# Patient Record
Sex: Female | Born: 1995 | Hispanic: No | Marital: Married | Smoking: Former smoker
Health system: Southern US, Community
[De-identification: ages and names within clinical notes are randomized; demographics above are authoritative.]

## PROBLEM LIST (undated history)

## (undated) ENCOUNTER — Inpatient Hospital Stay (HOSPITAL_COMMUNITY): Payer: Self-pay

## (undated) DIAGNOSIS — N189 Chronic kidney disease, unspecified: Secondary | ICD-10-CM

## (undated) HISTORY — PX: NO PAST SURGERIES: SHX2092

---

## 2016-12-27 ENCOUNTER — Encounter (HOSPITAL_COMMUNITY): Payer: Self-pay | Admitting: *Deleted

## 2016-12-27 ENCOUNTER — Inpatient Hospital Stay (HOSPITAL_COMMUNITY)
Admission: AD | Admit: 2016-12-27 | Discharge: 2016-12-27 | Disposition: A | Discharge status: Home / Self Care | Payer: Medicaid Other | Source: Ambulatory Visit | Attending: Obstetrics and Gynecology | Admitting: Obstetrics and Gynecology

## 2016-12-27 DIAGNOSIS — O218 Other vomiting complicating pregnancy: Secondary | ICD-10-CM | POA: Insufficient documentation

## 2016-12-27 DIAGNOSIS — O219 Vomiting of pregnancy, unspecified: Secondary | ICD-10-CM | POA: Diagnosis not present

## 2016-12-27 DIAGNOSIS — Z3A15 15 weeks gestation of pregnancy: Secondary | ICD-10-CM | POA: Insufficient documentation

## 2016-12-27 LAB — URINALYSIS, ROUTINE W REFLEX MICROSCOPIC
Bilirubin Urine: NEGATIVE
Glucose, UA: NEGATIVE mg/dL
Hgb urine dipstick: NEGATIVE
KETONES UR: NEGATIVE mg/dL
Nitrite: NEGATIVE
PH: 5 (ref 5.0–8.0)
Protein, ur: NEGATIVE mg/dL
SPECIFIC GRAVITY, URINE: 1.015 (ref 1.005–1.030)

## 2016-12-27 LAB — CBC
HCT: 32.1 % — ABNORMAL LOW (ref 36.0–46.0)
HEMOGLOBIN: 11.1 g/dL — AB (ref 12.0–15.0)
MCH: 27.1 pg (ref 26.0–34.0)
MCHC: 34.6 g/dL (ref 30.0–36.0)
MCV: 78.5 fL (ref 78.0–100.0)
PLATELETS: 148 10*3/uL — AB (ref 150–400)
RBC: 4.09 MIL/uL (ref 3.87–5.11)
RDW: 14.3 % (ref 11.5–15.5)
WBC: 9.8 10*3/uL (ref 4.0–10.5)

## 2016-12-27 MED ORDER — DOXYLAMINE-PYRIDOXINE 10-10 MG PO TBEC: DELAYED_RELEASE_TABLET | ORAL | 6 refills | Status: DC

## 2016-12-27 MED ORDER — SODIUM CHLORIDE 0.9 % IV SOLN
25.0000 mg | Freq: Once | INTRAVENOUS | Status: AC
  Administered 2016-12-27: 25 mg via INTRAVENOUS
  Filled 2016-12-27: qty 1

## 2016-12-27 NOTE — MAU Note (Signed)
Pt has been vomiting for several  1 week.  Feels like she might be constipated as well. C/o headache and "eyes" burning.

## 2016-12-27 NOTE — MAU Provider Note (Signed)
  History     CSN: [id number]  Arrival date and time: 12/27/16 1305   First Provider Initiated Contact with Patient 12/27/16 1352      Chief Complaint  Patient presents with  . Emesis   HPI 21 yo G1P0 at 2224w1d by LMP presenting today for the evaluation of nausea/emesis in pregnancy. Patient reports onset of emesis 2-3 weeks ago. She reports frequent snacks and being able to tolerate some liquids. However, she is not consuming large meals. She reports onset of headache this morning. She denies any sick contact. She reports subjective fevers. She denies dysuria. She denies vaginal bleeding or cramping pain. She has not started to feel fetal movement yet. She has started prenatal care but desires to transfer care in [place]   History reviewed. No pertinent past medical history.  No past surgical history on file.  No family history on file.  Social History  Substance Use Topics  . Smoking status: Not on file  . Smokeless tobacco: Not on file  . Alcohol use Not on file    Allergies: No Known Allergies  No prescriptions prior to admission.    Review of Systems  See pertinent in HPI Physical Exam   Blood pressure 135/73, pulse 116, temperature 98.8 F (37.1 C), temperature source Oral, resp. rate 18, height 4\' 11"  (1.499 m), weight 191 lb (86.6 kg), last menstrual period 09/12/2016.  Physical Exam GENERAL: Well-developed, well-nourished female in no acute distress.  ABDOMEN: Soft, nontender, nondistended.  PELVIC: Not indicated Bedside sono: +FH EXTREMITIES: No cyanosis, clubbing, or edema, 2+ distal pulses.  MAU Course  Procedures  MDM IV phenergan- Patient reports improvement following IVF and phenergan  CBC    Component Value Date/Time   WBC 9.8 12/27/2016 1435   RBC 4.09 12/27/2016 1435   HGB 11.1 (L) 12/27/2016 1435   HCT 32.1 (L) 12/27/2016 1435   PLT 148 (L) 12/27/2016 1435   MCV 78.5 12/27/2016 1435   MCH 27.1 12/27/2016 1435   MCHC 34.6 12/27/2016  1435   RDW 14.3 12/27/2016 1435     Assessment and Plan  21 yo G1P0 at 6724w1d with nausea and vomiting in pregnancy - Patient symptoms improved - Rx Diclegis provided - List of providers given to transfer care in [place] - Precautions reviewed - RTC prn Elonna Mcfarlane 12/27/2016, 4:20 PM

## 2016-12-27 NOTE — Discharge Instructions (Signed)
° °Hyperemesis Gravidarum °Hyperemesis gravidarum is a severe form of nausea and vomiting that happens during pregnancy. Hyperemesis is worse than morning sickness. It may cause you to have nausea or vomiting all day for many days. It may keep you from eating and drinking enough food and liquids. Hyperemesis usually occurs during the first half (the first 20 weeks) of pregnancy. It often goes away once a woman is in her second half of pregnancy. However, sometimes hyperemesis continues through an entire pregnancy. °What are the causes? °The cause of this condition is not known. It may be related to changes in chemicals (hormones) in the body during pregnancy, such as the high level of pregnancy hormone (human chorionic gonadotropin) or the increase in the female sex hormone (estrogen). °What are the signs or symptoms? °Symptoms of this condition include: °· Severe nausea and vomiting. °· Nausea that does not go away. °· Vomiting that does not allow you to keep any food down. °· Weight loss. °· Body fluid loss (dehydration). °· Having no desire to eat, or not liking food that you have previously enjoyed. °How is this diagnosed? °This condition may be diagnosed based on: °· A physical exam. °· Your medical history. °· Your symptoms. °· Blood tests. °· Urine tests. °How is this treated? °This condition may be managed with medicine. If medicines to do not help relieve nausea and vomiting, you may need to receive fluids through an IV tube at the hospital. °Follow these instructions at home: °· Take over-the-counter and prescription medicines only as told by your health care provider. °· Avoid iron pills and multivitamins that contain iron for the first 3-4 months of pregnancy. If you take prescription iron pills, do not stop taking them unless your health care provider approves. °· Take the following actions to help prevent nausea and vomiting: °¨ In the morning, before getting out of bed, try eating a couple of dry  crackers or a piece of toast. °¨ Avoid foods and smells that upset your stomach. Fatty and spicy foods may make nausea worse. °¨ Eat 5-6 small meals a day. °¨ Do not drink fluids while eating meals. Drink between meals. °¨ Eat or suck on things that have ginger in them. Ginger can help relieve nausea. °¨ Avoid food preparation. The smell of food can spoil your appetite or trigger nausea. °· Follow instructions from your health care provider about eating or drinking restrictions. °· For snacks, eat high-protein foods, such as cheese. °· Keep all follow-up and pre-birth (prenatal) visits as told by your health care provider. This is important. °Contact a health care provider if: °· You have pain in your abdomen. °· You have a severe headache. °· You have vision problems. °· You are losing weight. °Get help right away if: °· You cannot drink fluids without vomiting. °· You vomit blood. °· You have Izmael Duross nausea and vomiting. °· You are very weak. °· You are very thirsty. °· You feel dizzy. °· You faint. °· You have a fever or other symptoms that last for more than 2-3 days. °· You have a fever and your symptoms suddenly get worse. °Summary °· Hyperemesis gravidarum is a severe form of nausea and vomiting that happens during pregnancy. °· Making some changes to your eating habits may help relieve nausea and vomiting. °· This condition may be managed with medicine. °· If medicines to do not help relieve nausea and vomiting, you may need to receive fluids through an IV tube at the hospital. °This information   is not intended to replace advice given to you by your health care provider. Make sure you discuss any questions you have with your health care provider. °Document Released: 10/22/2005 Document Revised: 06/20/2016 Document Reviewed: 06/20/2016 °Elsevier Interactive Patient Education © 2017 [employer] ° ° °Eating Plan for Hyperemesis Gravidarum °Hyperemesis gravidarum is a severe form of morning sickness. Because  this condition causes severe nausea and vomiting, it can lead to dehydration, malnutrition, and weight loss. One way to lessen the symptoms of nausea and vomiting is to follow the eating plan for hyperemesis gravidarum. It is often used along with prescribed medicines to control your symptoms. °What can I do to relieve my symptoms? °Listen to your body. Everyone is different and has different preferences. Find what works best for you. Take any of the following actions that are helpful to you: °· Eat and drink slowly. °· Eat 5-6 small meals daily instead of 3 large meals. °· Eat crackers before you get out of bed in the morning. °· Try having a snack in the middle of the night. °· Starchy foods are usually tolerated well. Examples include cereal, toast, bread, potatoes, pasta, rice, and pretzels. °· Ginger may help with nausea. Add ¼ tsp ground ginger to hot tea or choose ginger tea. °· Try drinking 100% fruit juice or an electrolyte drink. An electrolyte drink contains sodium, potassium, and chloride. °· Continue to take your prenatal vitamins as told by your health care provider. If you are having trouble taking your prenatal vitamins, talk with your health care provider about different options. °· Include at least 1 serving of protein with your meals and snacks. Protein options include meats or poultry, beans, nuts, eggs, and yogurt. Try eating a protein-rich snack before bed. Examples of these snacks include cheese and crackers or half of a peanut butter or turkey sandwich. °· Consider eliminating foods that trigger your symptoms. These may include spicy foods, coffee, high-fat foods, very sweet foods, and acidic foods. °· Try meals that have more protein combined with bland, salty, lower-fat, and dry foods, such as nuts, seeds, pretzels, crackers, and cereal. °· Talk with your healthcare provider about starting a supplement of vitamin B6. °· Have fluids that are cold, clear, and carbonated or sour. Examples  include lemonade, ginger ale, lemon-lime soda, ice water, and sparkling water. °· Try lemon or mint tea. °· Try brushing your teeth or using a mouth rinse after meals. °What should I avoid to reduce my symptoms? °Avoiding some of the following things may help reduce your symptoms. °· Foods with strong smells. Try eating meals in well-ventilated areas that are free of odors. °· Drinking water or other beverages with meals. Try not to drink anything during the 30 minutes before and after your meals. °· Drinking more than 1 cup of fluid at a time. Sometimes using a straw helps. °· Fried or high-fat foods, such as butter and cream sauces. °· Spicy foods. °· Skipping meals as best as you can. Nausea can be more intense on an empty stomach. If you cannot tolerate food at that time, do not force it. Try sucking on ice chips or other frozen items, and make up for missed calories later. °· Lying down within 2 hours after eating. °· Environmental triggers. These may include smoky rooms, closed spaces, rooms with strong smells, warm or humid places, overly loud and noisy rooms, and rooms with motion or flickering lights. °· Quick and sudden changes in your movement. °This information is not intended   to replace advice given to you by your health care provider. Make sure you discuss any questions you have with your health care provider. °Document Released: 08/19/2007 Document Revised: 06/20/2016 Document Reviewed: 05/22/2016 °Elsevier Interactive Patient Education © 2017 [employer] ° ° °

## 2016-12-28 ENCOUNTER — Emergency Department (HOSPITAL_COMMUNITY): Payer: Medicaid Other

## 2016-12-28 ENCOUNTER — Emergency Department (HOSPITAL_COMMUNITY)
Admission: EM | Admit: 2016-12-28 | Discharge: 2016-12-28 | Disposition: A | Discharge status: Home / Self Care | Payer: Medicaid Other | Attending: Emergency Medicine | Admitting: Emergency Medicine

## 2016-12-28 ENCOUNTER — Encounter (HOSPITAL_COMMUNITY): Payer: Self-pay | Admitting: *Deleted

## 2016-12-28 DIAGNOSIS — O9989 Other specified diseases and conditions complicating pregnancy, childbirth and the puerperium: Secondary | ICD-10-CM

## 2016-12-28 DIAGNOSIS — Z3A16 16 weeks gestation of pregnancy: Secondary | ICD-10-CM | POA: Insufficient documentation

## 2016-12-28 DIAGNOSIS — R509 Fever, unspecified: Secondary | ICD-10-CM

## 2016-12-28 DIAGNOSIS — R109 Unspecified abdominal pain: Secondary | ICD-10-CM

## 2016-12-28 DIAGNOSIS — R102 Pelvic and perineal pain: Secondary | ICD-10-CM

## 2016-12-28 DIAGNOSIS — N390 Urinary tract infection, site not specified: Secondary | ICD-10-CM

## 2016-12-28 DIAGNOSIS — O2342 Unspecified infection of urinary tract in pregnancy, second trimester: Secondary | ICD-10-CM | POA: Diagnosis not present

## 2016-12-28 DIAGNOSIS — Z79899 Other long term (current) drug therapy: Secondary | ICD-10-CM | POA: Diagnosis not present

## 2016-12-28 DIAGNOSIS — M549 Dorsalgia, unspecified: Secondary | ICD-10-CM

## 2016-12-28 LAB — COMPREHENSIVE METABOLIC PANEL
ALK PHOS: 39 U/L (ref 38–126)
ALT: 41 U/L (ref 14–54)
AST: 27 U/L (ref 15–41)
Albumin: 3.6 g/dL (ref 3.5–5.0)
Anion gap: 8 (ref 5–15)
BUN: 6 mg/dL (ref 6–20)
CALCIUM: 9.2 mg/dL (ref 8.9–10.3)
CO2: 21 mmol/L — AB (ref 22–32)
CREATININE: 0.53 mg/dL (ref 0.44–1.00)
Chloride: 103 mmol/L (ref 101–111)
GFR calc non Af Amer: >60 mL/min (ref >60)
Glucose, Bld: 109 mg/dL — ABNORMAL HIGH (ref 65–99)
Potassium: 3.4 mmol/L — ABNORMAL LOW (ref 3.5–5.1)
SODIUM: 132 mmol/L — AB (ref 135–145)
Total Bilirubin: 0.8 mg/dL (ref 0.3–1.2)
Total Protein: 7.5 g/dL (ref 6.5–8.1)

## 2016-12-28 LAB — CBC
HCT: 32.1 % — ABNORMAL LOW (ref 36.0–46.0)
Hemoglobin: 11.1 g/dL — ABNORMAL LOW (ref 12.0–15.0)
MCH: 26.9 pg (ref 26.0–34.0)
MCHC: 34.6 g/dL (ref 30.0–36.0)
MCV: 77.9 fL — ABNORMAL LOW (ref 78.0–100.0)
PLATELETS: 153 10*3/uL (ref 150–400)
RBC: 4.12 MIL/uL (ref 3.87–5.11)
RDW: 14 % (ref 11.5–15.5)
WBC: 14.2 10*3/uL — ABNORMAL HIGH (ref 4.0–10.5)

## 2016-12-28 LAB — LIPASE, BLOOD: Lipase: 22 U/L (ref 11–51)

## 2016-12-28 LAB — URINALYSIS, ROUTINE W REFLEX MICROSCOPIC
Bacteria, UA: NONE SEEN
Bilirubin Urine: NEGATIVE
GLUCOSE, UA: NEGATIVE mg/dL
Hgb urine dipstick: NEGATIVE
Ketones, ur: 80 mg/dL — AB
Nitrite: NEGATIVE
Protein, ur: NEGATIVE mg/dL
SPECIFIC GRAVITY, URINE: 1.016 (ref 1.005–1.030)
pH: 6 (ref 5.0–8.0)

## 2016-12-28 LAB — I-STAT BETA HCG BLOOD, ED (MC, WL, AP ONLY): I-stat hCG, quantitative: >2000 m[IU]/mL — ABNORMAL HIGH (ref <5)

## 2016-12-28 LAB — I-STAT CG4 LACTIC ACID, ED
LACTIC ACID, VENOUS: 0.9 mmol/L (ref 0.5–1.9)
Lactic Acid, Venous: 1.48 mmol/L (ref 0.5–1.9)

## 2016-12-28 MED ORDER — CEFPODOXIME PROXETIL 100 MG PO TABS: 100.0000 mg | ORAL_TABLET | Freq: Two times a day (BID) | ORAL | 0 refills | Status: AC

## 2016-12-28 MED ORDER — SODIUM CHLORIDE 0.9 % IV SOLN
INTRAVENOUS | Status: DC
  Administered 2016-12-28: 16:00:00 via INTRAVENOUS

## 2016-12-28 MED ORDER — ACETAMINOPHEN 325 MG PO TABS
650.0000 mg | ORAL_TABLET | Freq: Once | ORAL | Status: AC
  Administered 2016-12-28: 650 mg via ORAL
  Filled 2016-12-28: qty 2

## 2016-12-28 MED ORDER — LACTATED RINGERS IV BOLUS (SEPSIS)
1000.0000 mL | Freq: Once | INTRAVENOUS | Status: AC
  Administered 2016-12-28: 1000 mL via INTRAVENOUS

## 2016-12-28 MED ORDER — ONDANSETRON HCL 4 MG/2ML IJ SOLN
4.0000 mg | Freq: Once | INTRAMUSCULAR | Status: AC
  Administered 2016-12-28: 4 mg via INTRAVENOUS
  Filled 2016-12-28: qty 2

## 2016-12-28 MED ORDER — CEFTRIAXONE SODIUM 1 G IJ SOLR
1.0000 g | Freq: Once | INTRAMUSCULAR | Status: AC
  Administered 2016-12-28: 1 g via INTRAVENOUS
  Filled 2016-12-28: qty 10

## 2016-12-28 NOTE — ED Notes (Signed)
SEEN YESTERDAY AND RECEIVED FLUIDS. RELEASED AFTER FLUIDS AND FELT BETTER, DID NOT HAVE STOMACH PAINS OR FEVER AT THAT VISIT.

## 2016-12-28 NOTE — ED Notes (Signed)
Pt cannot use restroom at this time, aware urine specimen is needed.  

## 2016-12-28 NOTE — Discharge Instructions (Signed)
Follow up with your OBGYN return for sudden worsening pain, fever.

## 2016-12-28 NOTE — ED Provider Notes (Signed)
21 yo F G1P0, with a chief complaints of lower abdominal pain. Was seen initially by Dr. Anitra LauthPlunkett. Plan for abdominal ultrasound to evaluate for possible kidney pathology as well as to evaluate that the uterus ovaries and baby have no abnormality. Dr. was concerned that the urine may be infected and recommended treatment. Sent off for culture. I discussed the results with the patient's husband. I am unable to reexamine the patient due to cultural issues. Per the husband she is feeling much better. I will start her on antibiotics. OBGYN follow-up.   Melene Planan Louay Myrie, DO 12/28/16 2155

## 2016-12-28 NOTE — ED Notes (Signed)
Ultrasound PRESENT 

## 2016-12-28 NOTE — ED Notes (Signed)
ED Provider at bedside. EDP PLUNKETT PRESENT

## 2016-12-28 NOTE — ED Triage Notes (Signed)
Pt' family reports pt was seen at some hospital yesterday, was given some IVF d/t dehydration.  He reports pt is 3 months pregnant, is having lower abd pain with nausea and vomiting.

## 2016-12-28 NOTE — ED Notes (Signed)
EDP PLUNKET WITH ULTRASOUND AT BEDSIDE

## 2016-12-28 NOTE — ED Provider Notes (Signed)
[facility]-EMERGENCY DEPT Provider Note   CSN: [id number] Arrival date & time: 12/28/16  1403     History   Chief Complaint Chief Complaint  Patient presents with  . Abdominal Pain  . Nausea    HPI Shelley Scott is a 21 y.o. female.  The history is provided by the patient and the spouse. The history is limited by a language barrier. A language interpreter was used.  Abdominal Pain   This is a new problem. The current episode started 3 to 5 hours ago. The problem occurs constantly. The problem has not changed since onset.The pain is associated with an unknown factor. The pain is located in the suprapubic region (right and left pelvic pain). The quality of the pain is sharp and shooting (radiates to the back). The pain is at a severity of 10/10. The pain is severe. Associated symptoms include anorexia, fever, nausea and vomiting. Pertinent negatives include diarrhea and dysuria. Associated symptoms comments: Pt is [redacted] weeks pregnant with an IUP per the pt.  Denies vaginal bleeding or discharge.  Husband states that she developed vomiting yesterday and was seen somewhere but he is not sure where and given fluids.  However this morning she developed lower abd pain radiating to the back, fever and return of vomiting.. Nothing aggravates the symptoms. Nothing relieves the symptoms.    History reviewed. No pertinent past medical history.  There are no active problems to display for this patient.   History reviewed. No pertinent surgical history.  OB History    Gravida Para Term Preterm AB Living   1             SAB TAB Ectopic Multiple Live Births                   Home Medications    Prior to Admission medications   Medication Sig Start Date End Date Taking? Authorizing Provider  Prenatal Vit-Fe Fumarate-FA (PRENATAL PO) Take 1 tablet by mouth daily.   Yes Historical Provider, MD  Doxylamine-Pyridoxine 10-10 MG TBEC Take 2 tabs at bedtime. If symptoms persist after 2 days, take 1 tab in  the morning and 2 tabs at bedtime Patient not taking: Reported on 12/28/2016 12/27/16   Catalina AntiguaPeggy Constant, MD    Family History No family history on file.  Social History Social History  Substance Use Topics  . Smoking status: Never Smoker  . Smokeless tobacco: Never Used  . Alcohol use No     Allergies   Patient has no known allergies.   Review of Systems Review of Systems  Constitutional: Positive for fever.  Gastrointestinal: Positive for abdominal pain, anorexia, nausea and vomiting. Negative for diarrhea.  Genitourinary: Negative for dysuria.  All other systems reviewed and are negative.    Physical Exam Updated Vital Signs BP 112/73   Pulse (!) 135   Temp 100.2 F (37.9 C) (Oral)   Resp 23   LMP 09/12/2016   SpO2 100%   Physical Exam  Constitutional: She is oriented to person, place, and time. She appears well-developed and well-nourished. She appears distressed.  Appears uncomfortable  HENT:  Head: Normocephalic and atraumatic.  Mouth/Throat: Oropharynx is clear and moist.  Eyes: Conjunctivae and EOM are normal. Pupils are equal, round, and reactive to light.  Neck: Normal range of motion. Neck supple.  Cardiovascular: Regular rhythm and intact distal pulses.  Tachycardia present.   No murmur heard. Pulmonary/Chest: Effort normal and breath sounds normal. No respiratory distress. She has no wheezes.  She has no rales.  Abdominal: Soft. She exhibits no distension. There is tenderness in the suprapubic area. There is guarding. There is no rebound.    Right and left pelvic pain  Musculoskeletal: Normal range of motion. She exhibits no edema or tenderness.  Neurological: She is alert and oriented to person, place, and time.  Skin: Skin is warm and dry. No rash noted. No erythema.  Psychiatric: She has a normal mood and affect. Her behavior is normal.  Nursing note and vitals reviewed.    ED Treatments / Results  Labs (all labs ordered are listed, but only  abnormal results are displayed) Labs Reviewed  COMPREHENSIVE METABOLIC PANEL - Abnormal; Notable for the following:       Result Value   Sodium 132 (*)    Potassium 3.4 (*)    CO2 21 (*)    Glucose, Bld 109 (*)    All other components within normal limits  CBC - Abnormal; Notable for the following:    WBC 14.2 (*)    Hemoglobin 11.1 (*)    HCT 32.1 (*)    MCV 77.9 (*)    All other components within normal limits  URINALYSIS, ROUTINE W REFLEX MICROSCOPIC - Abnormal; Notable for the following:    APPearance CLOUDY (*)    Ketones, ur 80 (*)    Leukocytes, UA LARGE (*)    Squamous Epithelial / LPF TOO NUMEROUS TO COUNT (*)    All other components within normal limits  I-STAT BETA HCG BLOOD, ED ([facility], [facility], [facility] ONLY) - Abnormal; Notable for the following:    I-stat hCG, quantitative >2,000.0 (*)    All other components within normal limits  LIPASE, BLOOD  I-STAT CG4 LACTIC ACID, ED  I-STAT CG4 LACTIC ACID, ED    EKG  EKG Interpretation None       Radiology No results found.  Procedures Procedures (including critical care time)  Medications Ordered in ED Medications  lactated ringers bolus 1,000 mL (1,000 mLs Intravenous New Bag/Given 12/28/16 1440)  acetaminophen (TYLENOL) tablet 650 mg (not administered)  ondansetron (ZOFRAN) injection 4 mg (not administered)  0.9 %  sodium chloride infusion (not administered)   EMERGENCY DEPARTMENT [place] PREGNANCY "Study: Limited Ultrasound of the Pelvis for Pregnancy"  INDICATIONS:Pregnancy(required) Multiple views of the uterus and pelvic cavity were obtained in real-time with a multi-frequency probe.  APPROACH:Transabdominal  PERFORMED BY: Myself IMAGES ARCHIVED?: Yes LIMITATIONS: none PREGNANCY FREE FLUID: None ADNEXAL FINDINGS:Left ovary not seen and Right ovary not seen GESTATIONAL AGE, ESTIMATE: 16w FETAL HEART RATE: 158 INTERPRETATION: Intrauterine gestational sac noted, Fetal pole present and Fetal heart activity  seen      Initial Impression / Assessment and Plan / ED Course  I have reviewed the triage vital signs and the nursing notes.  Pertinent labs & imaging results that were available during my care of the patient were reviewed by me and considered in my medical decision making (see chart for details).     Patient is a healthy 21 year old G1 P0 presenting today with fever, vomiting and lower abdominal pain radiating to her back. She denies any vaginal bleeding, cough, chest pain or shortness of breath. Patient appears very uncomfortable and states the pain is severe. The pain started this morning. Patient is febrile to 100.2 with a heart rate between 120-140.  Patient apparently went somewhere yesterday which her husband is not sure where she received IV fluids and felt better. However symptoms worsened this morning. She had a confirmed IUP  in the clinic in [place] earlier this pregnancy but is looking for an OB/GYN in this area. Patient has significant lower pelvic tenderness. Bedside ultrasound shows a fetus measuring approximately 16 weeks with heart rate of 158. No free fluid noted. Concern for potential pyelonephritis versus open. Torsion or ruptured cyst. Lower suspicion for appendicitis or bowel perforation. CBC with a leukocytosis of 14,000, lipase and lactic acid within normal limits. CMP without acute findings. UA pending. Patient given Zofran and IV fluids. She was given Tylenol for her fever.  5:33 PM UA is contaminated with epithelial cells but also large leukocytes and TNTC WBC's.  Urine culture sent but concern for pyelonephritis given pt's sx.  Will get renal u/s and due to severe pain will r/o ovarian torsion.  No signs of fetal demise.  On re-eval pt is feeling better and hungry but still tachy to 120. Pt checked out to Dr. Adela Lank at 1700.  Pt also given rocephin.  Final Clinical Impressions(s) / ED Diagnoses   Final diagnoses:  None    New Prescriptions New  Prescriptions   No medications on file     Gwyneth Sprout, MD 12/28/16 2115

## 2016-12-28 NOTE — ED Notes (Signed)
Pt request female nurse, cultural awareness. Trade with female nurse spencer for room 21

## 2016-12-30 LAB — URINE CULTURE

## 2017-02-17 ENCOUNTER — Encounter (HOSPITAL_COMMUNITY): Payer: Self-pay | Admitting: *Deleted

## 2017-02-17 ENCOUNTER — Inpatient Hospital Stay (HOSPITAL_COMMUNITY)
Admission: AD | Admit: 2017-02-17 | Discharge: 2017-02-17 | Disposition: A | Discharge status: Home / Self Care | Payer: Medicaid Other | Source: Ambulatory Visit | Attending: Obstetrics & Gynecology | Admitting: Obstetrics & Gynecology

## 2017-02-17 DIAGNOSIS — N189 Chronic kidney disease, unspecified: Secondary | ICD-10-CM | POA: Diagnosis not present

## 2017-02-17 DIAGNOSIS — Z3A22 22 weeks gestation of pregnancy: Secondary | ICD-10-CM | POA: Insufficient documentation

## 2017-02-17 DIAGNOSIS — O26832 Pregnancy related renal disease, second trimester: Secondary | ICD-10-CM | POA: Diagnosis present

## 2017-02-17 DIAGNOSIS — O368120 Decreased fetal movements, second trimester, not applicable or unspecified: Secondary | ICD-10-CM

## 2017-02-17 DIAGNOSIS — Z79899 Other long term (current) drug therapy: Secondary | ICD-10-CM | POA: Diagnosis not present

## 2017-02-17 HISTORY — DX: Chronic kidney disease, unspecified: N18.9

## 2017-02-17 NOTE — Discharge Instructions (Signed)
Second Trimester of Pregnancy The second trimester is from week 14 through week 27 (months 4 through 6). The second trimester is often a time when you feel your best. Your body has adjusted to being pregnant, and you begin to feel better physically. Usually, morning sickness has lessened or quit completely, you may have more energy, and you may have an increase in appetite. The second trimester is also a time when the fetus is growing rapidly. At the end of the sixth month, the fetus is about 9 inches long and weighs about 1 pounds. You will likely begin to feel the baby move (quickening) between 16 and 20 weeks of pregnancy. Body changes during your second trimester Your body continues to go through many changes during your second trimester. The changes vary from woman to woman.  Your weight will continue to increase. You will notice your lower abdomen bulging out.  You may begin to get stretch marks on your hips, abdomen, and breasts.  You may develop headaches that can be relieved by medicines. The medicines should be approved by your health care provider.  You may urinate more often because the fetus is pressing on your bladder.  You may develop or continue to have heartburn as a result of your pregnancy.  You may develop constipation because certain hormones are causing the muscles that push waste through your intestines to slow down.  You may develop hemorrhoids or swollen, bulging veins (varicose veins).  You may have back pain. This is caused by:  Weight gain.  Pregnancy hormones that are relaxing the joints in your pelvis.  A shift in weight and the muscles that support your balance.  Your breasts will continue to grow and they will continue to become tender.  Your gums may bleed and may be sensitive to brushing and flossing.  Dark spots or blotches (chloasma, mask of pregnancy) may develop on your face. This will likely fade after the baby is born.  A dark line from your  belly button to the pubic area (linea nigra) may appear. This will likely fade after the baby is born.  You may have changes in your hair. These can include thickening of your hair, rapid growth, and changes in texture. Some women also have hair loss during or after pregnancy, or hair that feels dry or thin. Your hair will most likely return to normal after your baby is born. What to expect at prenatal visits During a routine prenatal visit:  You will be weighed to make sure you and the fetus are growing normally.  Your blood pressure will be taken.  Your abdomen will be measured to track your baby's growth.  The fetal heartbeat will be listened to.  Any test results from the previous visit will be discussed. Your health care provider may ask you:  How you are feeling.  If you are feeling the baby move.  If you have had any abnormal symptoms, such as leaking fluid, bleeding, severe headaches, or abdominal cramping.  If you are using any tobacco products, including cigarettes, chewing tobacco, and electronic cigarettes.  If you have any questions. Other tests that may be performed during your second trimester include:  Blood tests that check for:  Low iron levels (anemia).  High blood sugar that affects pregnant women (gestational diabetes) between 78 and 28 weeks.  Rh antibodies. This is to check for a protein on red blood cells (Rh factor).  Urine tests to check for infections, diabetes, or protein in the  urine.  An ultrasound to confirm the proper growth and development of the baby.  An amniocentesis to check for possible genetic problems.  Fetal screens for spina bifida and Down syndrome.  HIV (human immunodeficiency virus) testing. Routine prenatal testing includes screening for HIV, unless you choose not to have this test. Follow these instructions at home: Medicines   Follow your health care provider's instructions regarding medicine use. Specific medicines may  be either safe or unsafe to take during pregnancy.  Take a prenatal vitamin that contains at least 600 micrograms (mcg) of folic acid.  If you develop constipation, try taking a stool softener if your health care provider approves. Eating and drinking   Eat a balanced diet that includes fresh fruits and vegetables, whole grains, good sources of protein such as meat, eggs, or tofu, and low-fat dairy. Your health care provider will help you determine the amount of weight gain that is right for you.  Avoid raw meat and uncooked cheese. These carry germs that can cause birth defects in the baby.  If you have low calcium intake from food, talk to your health care provider about whether you should take a daily calcium supplement.  Limit foods that are high in fat and processed sugars, such as fried and sweet foods.  To prevent constipation:  Drink enough fluid to keep your urine clear or pale yellow.  Eat foods that are high in fiber, such as fresh fruits and vegetables, whole grains, and beans. Activity   Exercise only as directed by your health care provider. Most women can continue their usual exercise routine during pregnancy. Try to exercise for 30 minutes at least 5 days a week. Stop exercising if you experience uterine contractions.  Avoid heavy lifting, wear low heel shoes, and practice good posture.  A sexual relationship may be continued unless your health care provider directs you otherwise. Relieving pain and discomfort   Wear a good support bra to prevent discomfort from breast tenderness.  Take warm sitz baths to soothe any pain or discomfort caused by hemorrhoids. Use hemorrhoid cream if your health care provider approves.  Rest with your legs elevated if you have leg cramps or low back pain.  If you develop varicose veins, wear support hose. Elevate your feet for 15 minutes, 3-4 times a day. Limit salt in your diet. Prenatal Care   Write down your questions. Take them  to your prenatal visits.  Keep all your prenatal visits as told by your health care provider. This is important. Safety   Wear your seat belt at all times when driving.  Make a list of emergency phone numbers, including numbers for family, friends, the hospital, and police and fire departments. General instructions   Ask your health care provider for a referral to a local prenatal education class. Begin classes no later than the beginning of month 6 of your pregnancy.  Ask for help if you have counseling or nutritional needs during pregnancy. Your health care provider can offer advice or refer you to specialists for help with various needs.  Do not use hot tubs, steam rooms, or saunas.  Do not douche or use tampons or scented sanitary pads.  Do not cross your legs for long periods of time.  Avoid cat litter boxes and soil used by cats. These carry germs that can cause birth defects in the baby and possibly loss of the fetus by miscarriage or stillbirth.  Avoid all smoking, herbs, alcohol, and unprescribed drugs. Chemicals in  these products can affect the formation and growth of the baby.  Do not use any products that contain nicotine or tobacco, such as cigarettes and e-cigarettes. If you need help quitting, ask your health care provider.  Visit your dentist if you have not gone yet during your pregnancy. Use a soft toothbrush to brush your teeth and be gentle when you floss. Contact a health care provider if:  You have dizziness.  You have mild pelvic cramps, pelvic pressure, or nagging pain in the abdominal area.  You have persistent nausea, vomiting, or diarrhea.  You have a bad smelling vaginal discharge.  You have pain when you urinate. Get help right away if:  You have a fever.  You are leaking fluid from your vagina.  You have spotting or bleeding from your vagina.  You have severe abdominal cramping or pain.  You have rapid weight gain or weight loss.  You  have shortness of breath with chest pain.  You notice sudden or extreme swelling of your face, hands, ankles, feet, or legs.  You have not felt your baby move in over an hour.  You have severe headaches that do not go away when you take medicine.  You have vision changes. Summary  The second trimester is from week 14 through week 27 (months 4 through 6). It is also a time when the fetus is growing rapidly.  Your body goes through many changes during pregnancy. The changes vary from woman to woman.  Avoid all smoking, herbs, alcohol, and unprescribed drugs. These chemicals affect the formation and growth your baby.  Do not use any tobacco products, such as cigarettes, chewing tobacco, and e-cigarettes. If you need help quitting, ask your health care provider.  Contact your health care provider if you have any questions. Keep all prenatal visits as told by your health care provider. This is important. This information is not intended to replace advice given to you by your health care provider. Make sure you discuss any questions you have with your health care provider. Document Released: 10/16/2001 Document Revised: 03/29/2016 Document Reviewed: 12/23/2012 Elsevier Interactive Patient Education  2017 [employer].  Prenatal Care Providers [facility]    [facility]  [phone]     Phone: [phone]          [facility]                      [facility]   [place]     Phone: [phone]  Phone: [phone]         [facility]     Phone: [phone]  Phone: [phone]           [facility] [place]                hone: [phone]  Phone: [phone]         [facility] [address] Ceo      Phone: [phone]  Phone: [phone]         [facility] Dept.                Phone:  [phone]  [facility]   [address] [place])          Phone: [phone] [facility]   Phone: [phone]

## 2017-02-17 NOTE — MAU Provider Note (Signed)
Chief Complaint:  No chief complaint on file.   First Provider Initiated Contact with Patient 02/17/17 1943     HPI  HPI: Shelley Scott is a 21 y.o. G1P0 at 36w4dwho presents to maternity admissions reporting no fetal movement for the past 3 days.  Denies other symptoms.  States had been feeling daily movement prior to this.  She denies LOF, vaginal bleeding, vaginal itching/burning, urinary symptoms, h/a, dizziness, n/v, diarrhea, constipation or fever/chills.    Gets care in [place] but wants to move care to here. States their doctor is Arabic and had to leave the country for a few months.    RN Note: Pt arrived c/o no fetal movement for the last three days. Denies any vag bleeding, leaking,  Discharge or pain..  Past Medical History: Past Medical History:  Diagnosis Date  . Chronic kidney disease    kidneyinfection with this pregnancy    Past obstetric history: OB History  Gravida Para Term Preterm AB Living  1            SAB TAB Ectopic Multiple Live Births               # Outcome Date GA Lbr Len/2nd Weight Sex Delivery Anes PTL Lv  1 Current               Past Surgical History: Past Surgical History:  Procedure Laterality Date  . NO PAST SURGERIES      Family History: History reviewed. No pertinent family history.  Social History: Social History  Substance Use Topics  . Smoking status: Never Smoker  . Smokeless tobacco: Former Neurosurgeon     Comment: hooka usage but not while preg  . Alcohol use No    Allergies: No Known Allergies  Meds:  Prescriptions Prior to Admission  Medication Sig Dispense Refill Last Dose  . Doxylamine-Pyridoxine 10-10 MG TBEC Take 2 tabs at bedtime. If symptoms persist after 2 days, take 1 tab in the morning and 2 tabs at bedtime (Patient not taking: Reported on 12/28/2016) 100 tablet 6 Not Taking at Unknown time  . Prenatal Vit-Fe Fumarate-FA (PRENATAL PO) Take 1 tablet by mouth daily.   12/27/2016 at Unknown time    I have reviewed  patient's Past Medical Hx, Surgical Hx, Family Hx, Social Hx, medications and allergies.   ROS:  Review of Systems  Constitutional: Negative for chills, fatigue and fever.  Respiratory: Negative for shortness of breath.   Gastrointestinal: Negative for abdominal pain, constipation, diarrhea, nausea and vomiting.  Genitourinary: Negative for dysuria, pelvic pain and vaginal bleeding.  Neurological: Negative for dizziness.   Other systems negative  Physical Exam  Patient Vitals for the past 24 hrs:  BP Temp Temp src Pulse Resp  02/17/17 1922 133/69 98.1 F (36.7 C) Oral (!) 106 16   Constitutional: Well-developed, well-nourished female in no acute distress.  Cardiovascular: normal rate and rhythm Respiratory: normal effort GI: Abd soft, non-tender, gravid appropriate for gestational age.   No rebound or guarding. MS: Extremities nontender, no edema, normal ROM Neurologic: Alert and oriented x 4.  GU: Neg CVAT.  PELVIC EXAM: Deferred Exam by:: Artelia Laroche CNM  FHT:  150 by bedside US .  Fetus is active and moving.  Patient does not feel this movement seen on screen  Labs: No results found for this or any previous visit (from the past 24 hour(s)).    Imaging:  No results found.  MAU Course/MDM: Treatments in MAU included bedside US to  verify FH activity Reassurance offered WIll give list of providers.    Assessment: Single IUP at [redacted]w[redacted]d Decreased perception of fetal movement  Plan: Discharge home Preterm Labor precautions and fetal kick counts Follow up in Office for prenatal visits and recheck of status  Encouraged to return here or to other Urgent Care/ED if she develops worsening of symptoms, increase in pain, fever, or other concerning symptoms.   Pt stable at time of discharge.  Wynelle Bourgeois CNM, MSN Certified Nurse-Midwife 02/17/2017 7:44 PM

## 2017-02-17 NOTE — MAU Note (Signed)
Pt arrived c/o no fetal movement for the last three days. Denies any vag bleeding, leaking,  Discharge or pain.Marland Kitchen

## 2017-02-25 ENCOUNTER — Encounter (HOSPITAL_COMMUNITY): Payer: Self-pay | Admitting: *Deleted

## 2017-02-25 ENCOUNTER — Inpatient Hospital Stay (HOSPITAL_COMMUNITY)
Admission: AD | Admit: 2017-02-25 | Discharge: 2017-02-25 | Disposition: A | Discharge status: Home / Self Care | Payer: Medicaid Other | Source: Ambulatory Visit | Attending: Obstetrics & Gynecology | Admitting: Obstetrics & Gynecology

## 2017-02-25 DIAGNOSIS — Z79899 Other long term (current) drug therapy: Secondary | ICD-10-CM | POA: Insufficient documentation

## 2017-02-25 DIAGNOSIS — O26892 Other specified pregnancy related conditions, second trimester: Secondary | ICD-10-CM | POA: Diagnosis present

## 2017-02-25 DIAGNOSIS — R300 Dysuria: Secondary | ICD-10-CM | POA: Diagnosis not present

## 2017-02-25 DIAGNOSIS — O26832 Pregnancy related renal disease, second trimester: Secondary | ICD-10-CM | POA: Insufficient documentation

## 2017-02-25 DIAGNOSIS — O2342 Unspecified infection of urinary tract in pregnancy, second trimester: Secondary | ICD-10-CM | POA: Insufficient documentation

## 2017-02-25 DIAGNOSIS — M549 Dorsalgia, unspecified: Secondary | ICD-10-CM | POA: Diagnosis not present

## 2017-02-25 DIAGNOSIS — Z87891 Personal history of nicotine dependence: Secondary | ICD-10-CM | POA: Diagnosis not present

## 2017-02-25 DIAGNOSIS — O4692 Antepartum hemorrhage, unspecified, second trimester: Secondary | ICD-10-CM | POA: Diagnosis present

## 2017-02-25 DIAGNOSIS — Z3A23 23 weeks gestation of pregnancy: Secondary | ICD-10-CM | POA: Diagnosis not present

## 2017-02-25 LAB — URINALYSIS, ROUTINE W REFLEX MICROSCOPIC
Bilirubin Urine: NEGATIVE
GLUCOSE, UA: NEGATIVE mg/dL
Ketones, ur: NEGATIVE mg/dL
NITRITE: NEGATIVE
PH: 7 (ref 5.0–8.0)
Protein, ur: NEGATIVE mg/dL
Specific Gravity, Urine: 1.004 — ABNORMAL LOW (ref 1.005–1.030)

## 2017-02-25 MED ORDER — CEPHALEXIN 500 MG PO CAPS: 500.0000 mg | ORAL_CAPSULE | Freq: Four times a day (QID) | ORAL | 0 refills | Status: DC

## 2017-02-25 NOTE — MAU Provider Note (Signed)
History     CSN: [id number]  Arrival date and time: 02/25/17 1407  First Provider Initiated Contact with Patient 02/25/17 1520      Chief Complaint  Patient presents with  . Vaginal Bleeding   HPI Shelley Scott is a 21 y.o. G1P0 at [redacted]w[redacted]d who presents with vaginal bleeding. Reports pink spotting on toilet paper this afternoon. Associated with dysuria & back pain. Denies LOF, abdominal pain, fever/chills, or n/v. Goes to [facility] for prenatal care but is transferring to [place] since her doctor will be out of the country; has not chosen a new OB yet & has not been seen for prenatal care since the end of March.   OB History    Gravida Para Term Preterm AB Living   1             SAB TAB Ectopic Multiple Live Births                  Past Medical History:  Diagnosis Date  . Chronic kidney disease    kidneyinfection with this pregnancy    Past Surgical History:  Procedure Laterality Date  . NO PAST SURGERIES      History reviewed. No pertinent family history.  Social History  Substance Use Topics  . Smoking status: Former Games developer  . Smokeless tobacco: Former Neurosurgeon     Comment: hooka usage but not while preg  . Alcohol use No    Allergies: No Known Allergies  Prescriptions Prior to Admission  Medication Sig Dispense Refill Last Dose  . Prenatal Vit-Fe Fumarate-FA (PRENATAL PO) Take 1 tablet by mouth daily.   02/24/2017 at Unknown time  . Doxylamine-Pyridoxine 10-10 MG TBEC Take 2 tabs at bedtime. If symptoms persist after 2 days, take 1 tab in the morning and 2 tabs at bedtime (Patient not taking: Reported on 12/28/2016) 100 tablet 6 Not Taking at Unknown time    Review of Systems  Constitutional: Negative.   Gastrointestinal: Negative.  Negative for abdominal pain and vomiting.  Genitourinary: Positive for dysuria and vaginal bleeding. Negative for flank pain, frequency, urgency and vaginal discharge.  Musculoskeletal: Positive for back pain.   Physical Exam    Blood pressure 125/68, pulse (!) 103, temperature 98.6 F (37 C), temperature source Oral, resp. rate 18, weight 205 lb 8 oz (93.2 kg), last menstrual period 09/12/2016.  Physical Exam  Nursing note and vitals reviewed. Constitutional: She is oriented to person, place, and time. She appears well-developed and well-nourished. No distress.  HENT:  Head: Normocephalic and atraumatic.  Eyes: Conjunctivae are normal. Right eye exhibits no discharge. Left eye exhibits no discharge. No scleral icterus.  Neck: Normal range of motion.  Respiratory: Effort normal. No respiratory distress.  GI: Soft. There is no tenderness. There is no CVA tenderness.  Genitourinary: There is no rash, tenderness or lesion on the right labia. There is no rash, tenderness or lesion on the left labia. Uterus is enlarged (24 week size ). Uterus is not tender. Cervix exhibits no motion tenderness, no discharge and no friability. No erythema, tenderness or bleeding in the vagina. No vaginal discharge found.  Genitourinary Comments: Cervix is firm and closed.  Neg for blood in vaginal canal  Neurological: She is alert and oriented to person, place, and time.  Skin: Skin is warm and dry. She is not diaphoretic.  Psychiatric: She has a normal mood and affect. Her behavior is normal. Judgment and thought content normal.   Fetal Tracing:  Baseline: 135  Variability: moderate Accelerations: none Decelerations: variable   Toco: x1 MAU Course  Procedures Results for orders placed or performed during the hospital encounter of 02/25/17 (from the past 24 hour(s))  Urinalysis, Routine w reflex microscopic     Status: Abnormal   Collection Time: 02/25/17  2:38 PM  Result Value Ref Range   Color, Urine STRAW (A) YELLOW   APPearance CLEAR CLEAR   Specific Gravity, Urine 1.004 (L) 1.005 - 1.030   pH 7.0 5.0 - 8.0   Glucose, UA NEGATIVE NEGATIVE mg/dL   Hgb urine dipstick LARGE (A) NEGATIVE   Bilirubin Urine NEGATIVE NEGATIVE    Ketones, ur NEGATIVE NEGATIVE mg/dL   Protein, ur NEGATIVE NEGATIVE mg/dL   Nitrite NEGATIVE NEGATIVE   Leukocytes, UA MODERATE (A) NEGATIVE   RBC / HPF 6-30 0 - 5 RBC/hpf   WBC, UA TOO NUMEROUS TO COUNT 0 - 5 WBC/hpf   Bacteria, UA RARE (A) NONE SEEN   Squamous Epithelial / LPF 0-5 (A) NONE SEEN   Sperm, UA PRESENT     MDM: Fetal tracing appropriate for gestation Urine culture pending  Care turned over to [facility]     Judeth Horn, NP 02/25/2017 4:11 PM   Exam Labs Offered translator-declined by husband.  He states they have seen an MD in [place] who is Arabic but is out of the country.  They are planning to transfer OB care to [facility].   Assessment and Plan   A:  Acute urinary tract infection      Second Trimester Gestation at [redacted]w[redacted]d gestation  P:  Discussed need to treat with antibiotic and the importance of taking as directed but till completed      Increase po fluids      Continue to find MD for OB care      Return to MAU if she develops fever, chills or worsening sxs.

## 2017-02-25 NOTE — MAU Note (Addendum)
Having vag bleeding. Started about 1200.  Had some burning when she was urinating and saw the blood.. Having some back ache. left CVA tenderness

## 2017-02-25 NOTE — Discharge Instructions (Signed)

## 2017-02-26 LAB — CULTURE, OB URINE

## 2017-09-11 ENCOUNTER — Encounter (HOSPITAL_COMMUNITY): Payer: Self-pay

## 2017-10-22 ENCOUNTER — Inpatient Hospital Stay (HOSPITAL_COMMUNITY)
Admission: AD | Admit: 2017-10-22 | Discharge: 2017-10-22 | Disposition: A | Discharge status: Home / Self Care | Payer: Self-pay | Source: Ambulatory Visit | Attending: Family Medicine | Admitting: Family Medicine

## 2017-10-22 DIAGNOSIS — Z3202 Encounter for pregnancy test, result negative: Secondary | ICD-10-CM | POA: Insufficient documentation

## 2017-10-22 DIAGNOSIS — N943 Premenstrual tension syndrome: Secondary | ICD-10-CM

## 2017-10-22 DIAGNOSIS — N939 Abnormal uterine and vaginal bleeding, unspecified: Secondary | ICD-10-CM | POA: Insufficient documentation

## 2017-10-22 LAB — POCT PREGNANCY, URINE: Preg Test, Ur: NEGATIVE

## 2017-10-22 NOTE — MAU Provider Note (Signed)
Shelley Scott  is a 21 y.o. G1P1 whom is about 4 months postpartum. She presents to MAU for late period (3 days), now bleeding. She also is endorsing some fatigue and nausea. Went on goggle and concerned that she may be pregnant or have an ovarian cyst. Denies fever, emesis, abdominal pain. Not currently breastfeeding. Had a regular period last month but without other associated symptoms.   Seen in triage due to full rooms.   All systems reviewed and are negative for acute change except as noted in the HPI.  BP (!) 123/55 (BP Location: Right Arm)   Pulse 92   Temp 98.1 F (36.7 C) (Oral)   Resp 18   Wt 212 lb (96.2 kg)   SpO2 100%   BMI 42.82 kg/m   CONSTITUTIONAL: Well-developed, well-nourished female in no acute distress.  ENT: External right and left ear normal.  EYES: EOM intact, conjunctivae normal.  MUSCULOSKELETAL: Normal range of motion.  CARDIOVASCULAR: Regular heart rate RESPIRATORY: Normal effort ABDOMEN: soft, non tender, nondistended NEUROLOGICAL: Alert and oriented to person, place, and time.  SKIN: Skin is warm and dry. No rash noted. Not diaphoretic. No erythema. No pallor. PSYCH: Normal mood and affect. Normal behavior. Normal judgment and thought content.   Results for orders placed or performed during the hospital encounter of 10/22/17 (from the past 24 hour(s))  Pregnancy, urine POC     Status: None   Collection Time: 10/22/17  6:44 PM  Result Value Ref Range   Preg Test, Ur NEGATIVE NEGATIVE   MDM: Vitals stable No pain Declined needing further work-up in MAU.   A: Vaginal bleeding Menses Premenstrual symptoms  P: Discharge home Return precautions given Handout provided Patient may return to MAU as needed or if her condition were to change or worsen Follow-up with Shelley Scott  Phelps, Jazma Y, DO 10/23/2017 1:39 PM

## 2017-10-22 NOTE — Discharge Instructions (Signed)
Menstruation Menstruation is the monthly passing of blood, tissue, fluid, and mucus. It is also known as a period. Your body is shedding the lining of the uterus. The flow of blood usually occurs during 3-7 consecutive days each month. Hormones control the menstrual cycle. Hormones are a chemical substance produced by endocrine glands in the body to regulate different bodily functions. The first menstrual period may start any time between age 22 years to 16 years. However, it usually starts around age 12 years. Some girls have regular monthly menstrual cycles right from the beginning. However, it is not unusual to have only a couple of drops of blood or spotting when you first start menstruating. It is also not unusual to have two periods a month or miss a month or two when first starting your periods. What are the symptoms?  Mild to moderate abdominal cramps.  Aching or pain in the lower back area. Symptoms may occur 5-10 days before your menstrual period starts. These symptoms are referred to as premenstrual syndrome (PMS). These symptoms can include:  Headache.  Breast tenderness and swelling.  Bloating.  Tiredness (fatigue).  Mood changes.  Craving for certain foods. These are normal signs and symptoms and can vary in severity. To help relieve these problems, ask your caregiver if you can take over-the-counter medications for pain or discomfort. If the symptoms are not controllable, see your caregiver for help. Hormones involved in menstruation Menstruation comes about because of hormones produced by the pituitary gland in the brain and the ovaries that affect the uterine lining. First, the pituitary gland in the brain produces the hormone follicle stimulating hormone (FSH). FSH stimulates the ovaries to produce estrogen, which thickens the uterine lining and begins to develop an egg in the ovary. About 14 days later, the pituitary gland produces another hormone called luteinizing hormone  (LH). LH causes the egg to come out of a sac in the ovary (ovulation). The empty sac on the ovary called the corpus luteum is stimulated by another hormone from the pituitary gland called luteotropin. The corpus luteum begins to produce the estrogen and progesterone hormone. The progesterone hormone prepares the lining of the uterus to have the fertilized egg (egg combined with sperm) attach to the lining of the uterus and begin to develop into a fetus. If the egg is not fertilized, the corpus luteum stops producing estrogen and progesterone, it disappears, the lining of the uterus sloughs off and a menstrual period begins. Then the menstrual cycle starts all over again and will continue monthly unless pregnancy occurs or menopause begins. The secretion of hormones is complex. Various parts of the body become involved in many chemical activities. Female sex hormones have other functions in a woman's body as well. Estrogen increases a woman's sex drive (libido). It naturally helps body get rid of fluids (diuretic). It also aids in the process of building new bone. Therefore, maintaining hormonal health is essential to all levels of a woman's well being. These hormones are usually present in normal amounts and cause you to menstruate. It is the relationship between the (small) levels of the hormones that is critical. When the balance is upset, menstrual irregularities can occur. How does the menstrual cycle happen?  Menstrual cycles vary in length from 21-35 days with an average of 29 days. The cycle begins on the first day of bleeding. At this time, the pituitary gland in the brain releases FSH that travels through the bloodstream to the ovaries. The FSH stimulates the follicles   in the ovaries. This prepares the body for ovulation that occurs around the 14th day of the cycle. The ovaries produce estrogen, and this makes sure conditions are right in the uterus for implantation of the fertilized egg.  When the  levels of estrogen reach a high enough level, it signals the gland in the brain (pituitary gland) to release a surge of LH. This causes the release of the ripest egg from its follicle (ovulation). Usually only one follicle releases one egg, but sometimes more than one follicle releases an egg especially when stimulating the ovaries for in vitro fertilization. The egg can then be collected by either fallopian tube to await fertilization. The burst follicle within the ovary that is left behind is now called the corpus luteum or "yellow body." The corpus luteum continues to give off (secrete) reduced amounts of estrogen. This closes and hardens the cervix. It dries up the mucus to the naturally infertile condition.  The corpus luteum also begins to give off greater amounts of progesterone. This causes the lining of the uterus (endometrium) to thicken even more in preparation for the fertilized egg. The egg is starting to journey down from the fallopian tube to the uterus. It also signals the ovaries to stop releasing eggs. It assists in returning the cervical mucus to its infertile state.  If the egg implants successfully into the womb lining and pregnancy occurs, progesterone levels will continue to raise. It is often this hormone that gives some pregnant women a feeling of well being, like a "natural high." Progesterone levels drop again after childbirth.  If fertilization does not occur, the corpus luteum dies, stopping the production of hormones. This sudden drop in progesterone causes the uterine lining to break down, accompanied by blood (menstruation).  This starts the cycle back at day 1. The whole process starts all over again. Woman go through this cycle every month from puberty to menopause. Women have breaks only for pregnancy and breastfeeding (lactation), unless the woman has health problems that affect the female hormone system or chooses to use oral contraceptives to have unnatural menstrual  periods. Follow these instructions at home:  Keep track of your periods by using a calendar.  If you use tampons, get the least absorbent to avoid toxic shock syndrome.  Do not leave tampons in the vagina over night or longer than 6 hours.  Wear a sanitary pad over night.  Exercise 3-5 times a week or more.  Avoid foods and drinks that you know will make your symptoms worse before or during your period. Contact a health care provider if:  You develop a fever with your period.  Your periods are lasting more than 7 days.  Your period is so heavy that you have to change pads or tampons every 30 minutes.  You develop clots with your period and never had clots before.  You cannot get relief from over-the-counter medication for your symptoms.  Your period has not started, and it has been longer than 35 days. This information is not intended to replace advice given to you by your health care provider. Make sure you discuss any questions you have with your health care provider. Document Released: 10/12/2002 Document Revised: 03/29/2016 Document Reviewed: 05/21/2013 Elsevier Interactive Patient Education  2017 [employer]  

## 2017-11-06 IMAGING — US US RENAL
1 series · 14 of 25 positions shown · non-contrast
Comparison: None.

CLINICAL DATA: Acute back pain

EXAM:
RENAL / URINARY TRACT ULTRASOUND COMPLETE

[Series 1: us renal · 0.25mm/px · 14 of 88 slices shown]
[im 1/88]
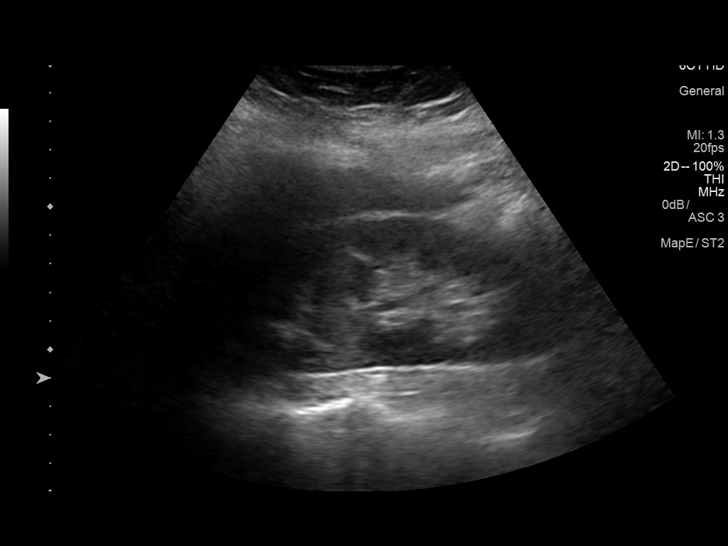
[im 8/88]
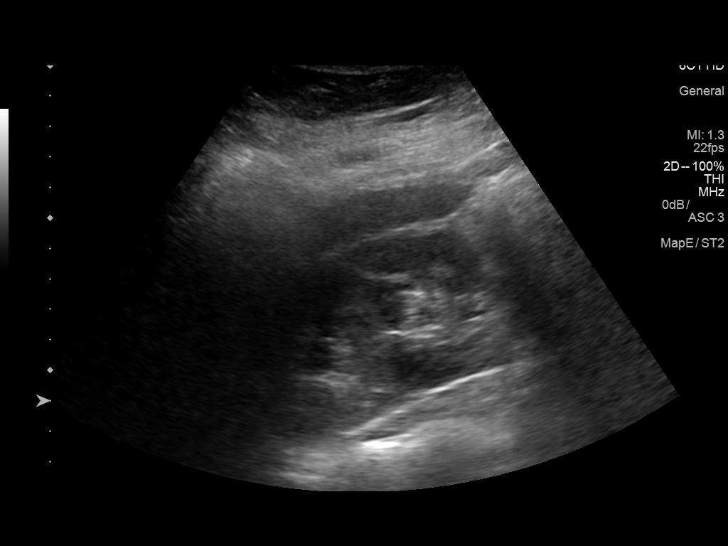
[im 15/88]
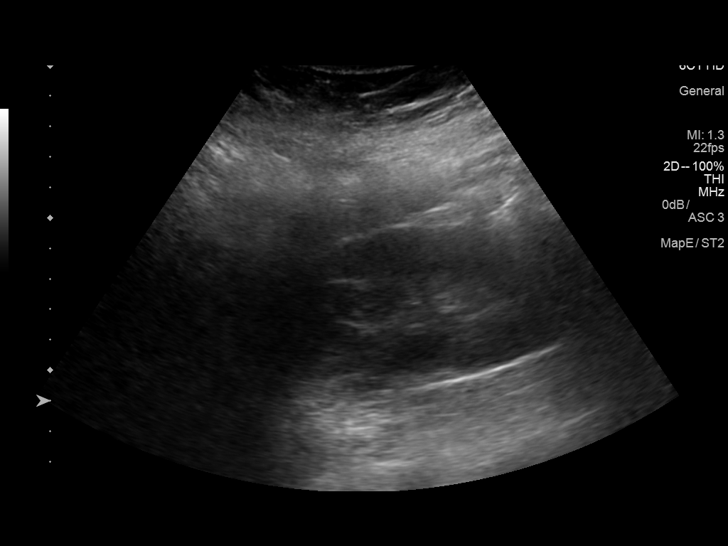
[im 22/88]
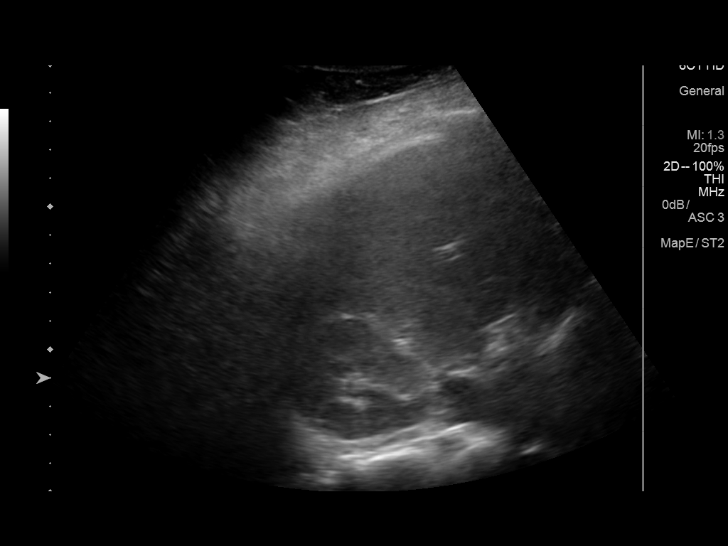
[im 30/88]
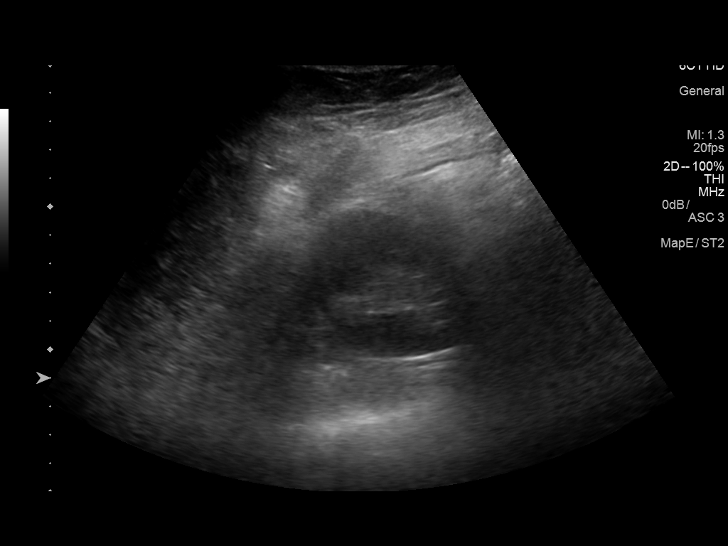
[im 33/88]
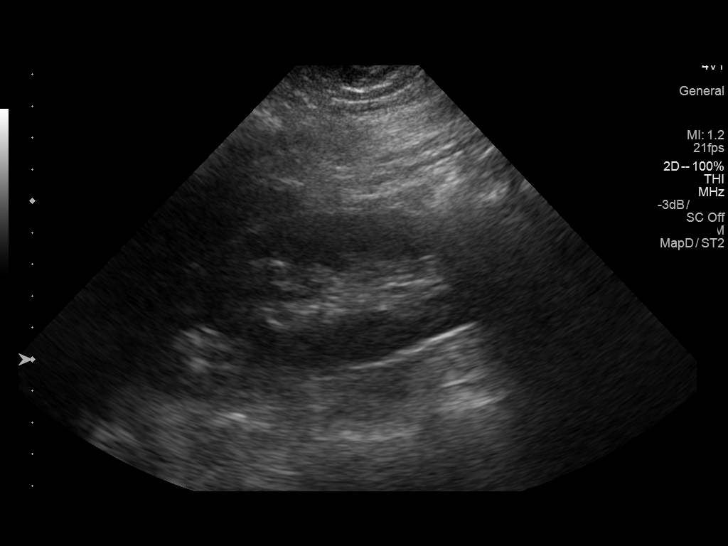
[im 40/88]
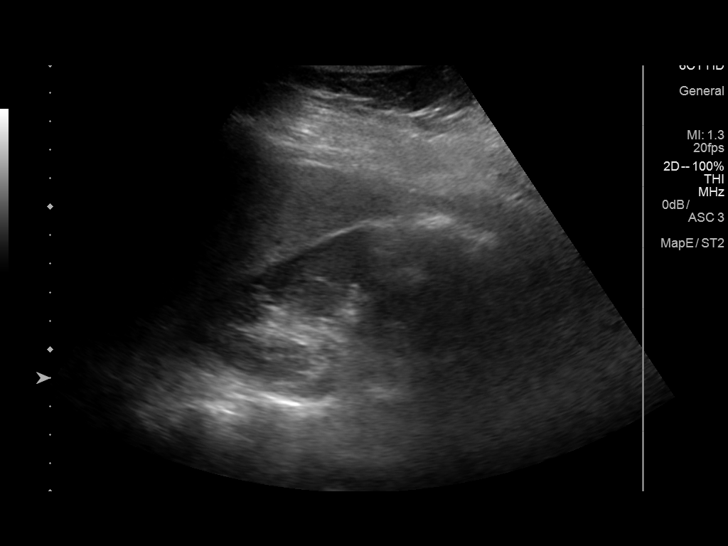
[im 48/88]
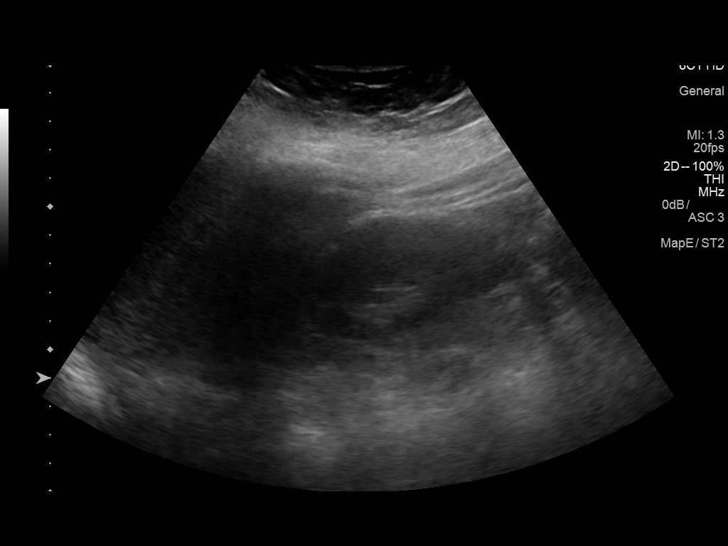
[im 55/88]
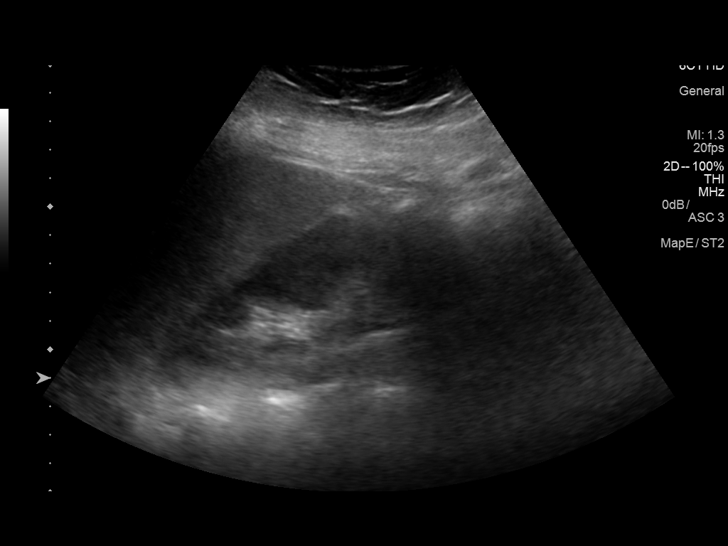
[im 59/88]
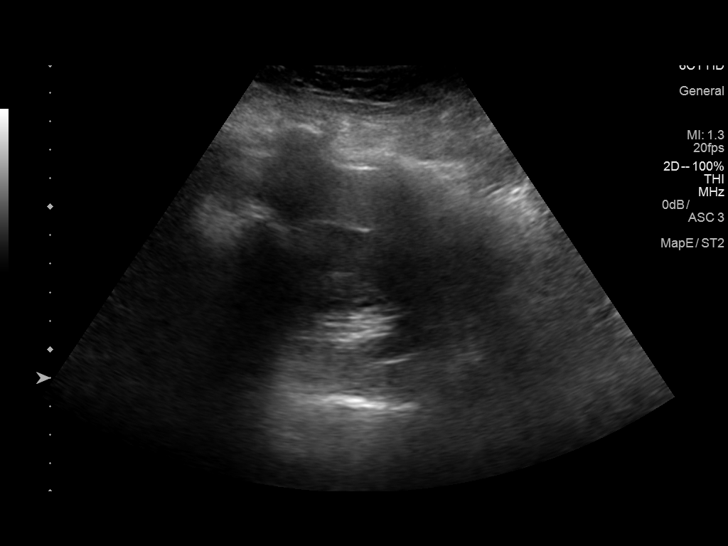
[im 66/88]
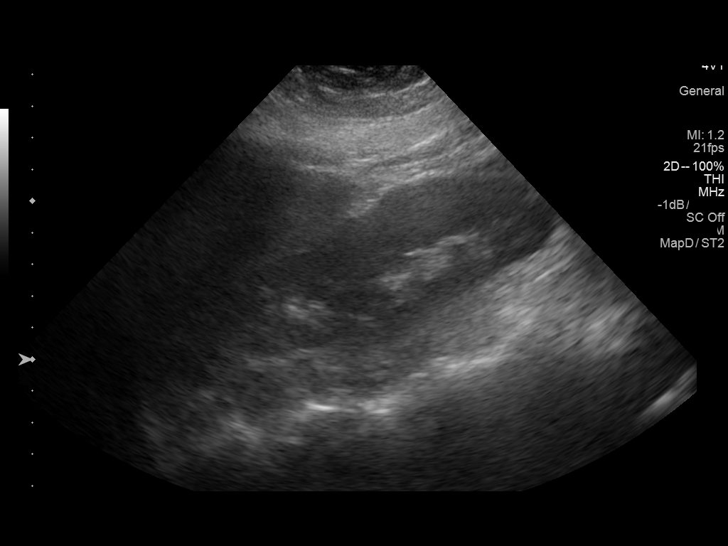
[im 73/88]
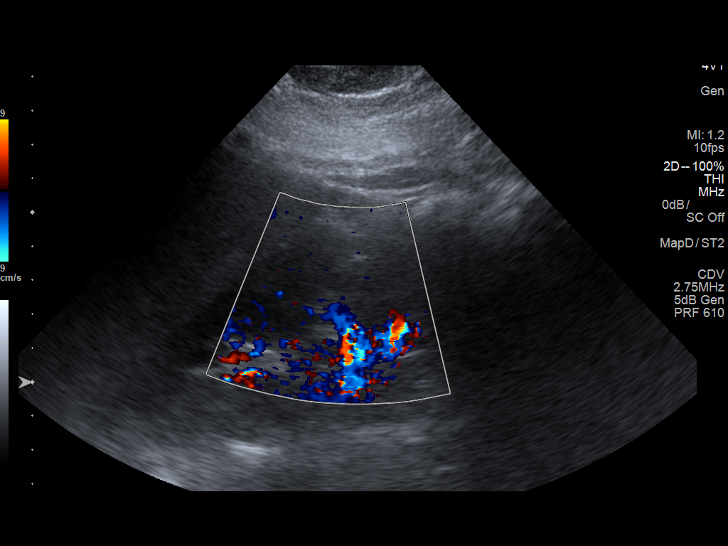
[im 80/88]
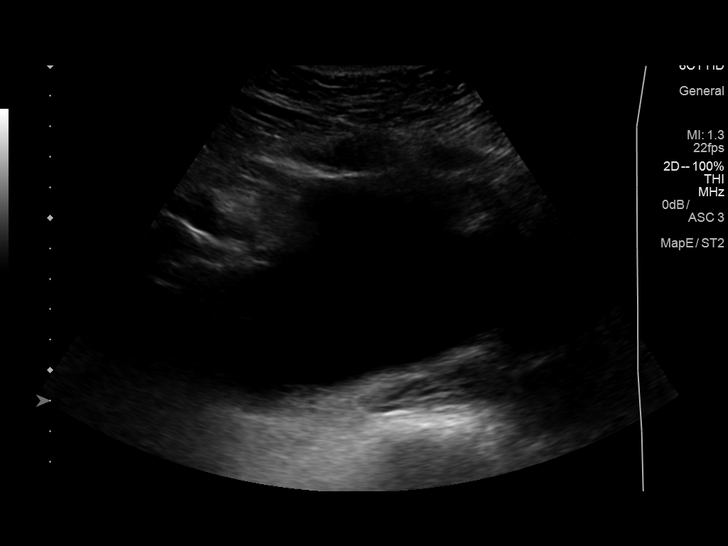
[im 88/88]
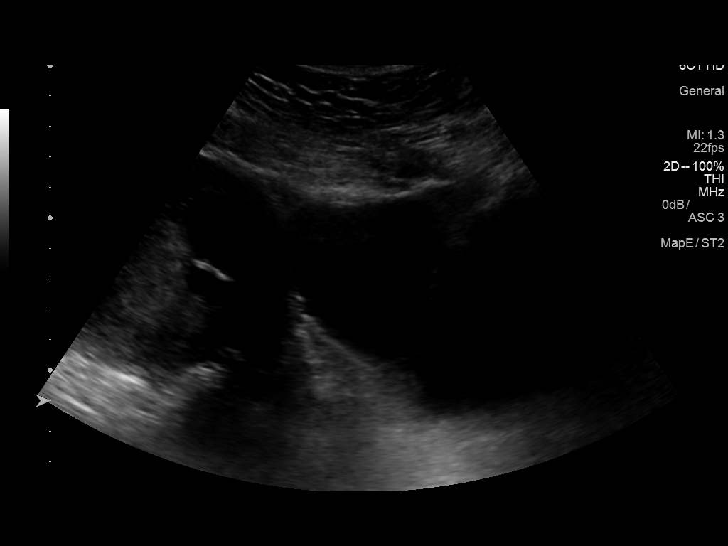

[14 of 25 positions shown; findings below may reference images not displayed]

FINDINGS: Right Kidney:

Length: 14.6 cm. Echogenicity within normal limits. No mass or
hydronephrosis visualized.

Left Kidney:

Length: 13.6 cm. Echogenicity within normal limits. No mass or
hydronephrosis visualized.

Bladder:

Appears normal for degree of bladder distention.
IMPRESSION: Negative renal ultrasound

## 2017-11-06 IMAGING — US US OB LIMITED
1 series · 14 of 28 positions shown · non-contrast
Comparison: none

CLINICAL DATA: Acute onset of pelvic pain.  Initial encounter.

EXAM:
LIMITED OBSTETRIC ULTRASOUND

[Series 1: us ob limited · 0.28mm/px · 14 of 84 slices shown]
[im 4/84]
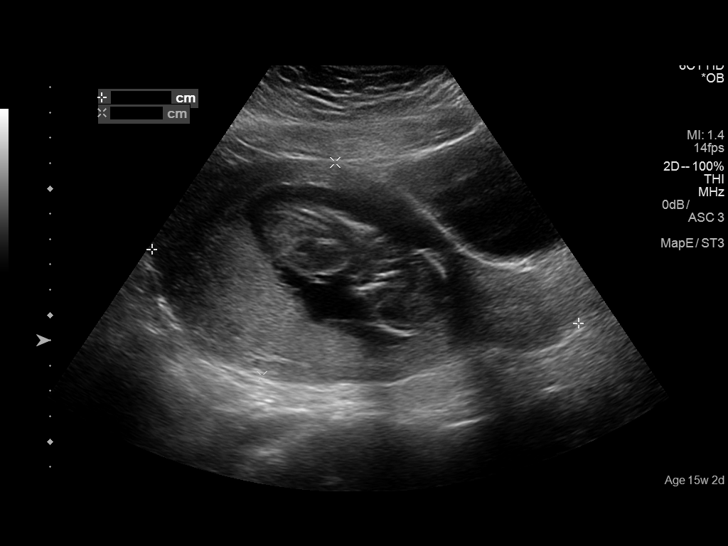
[im 10/84]
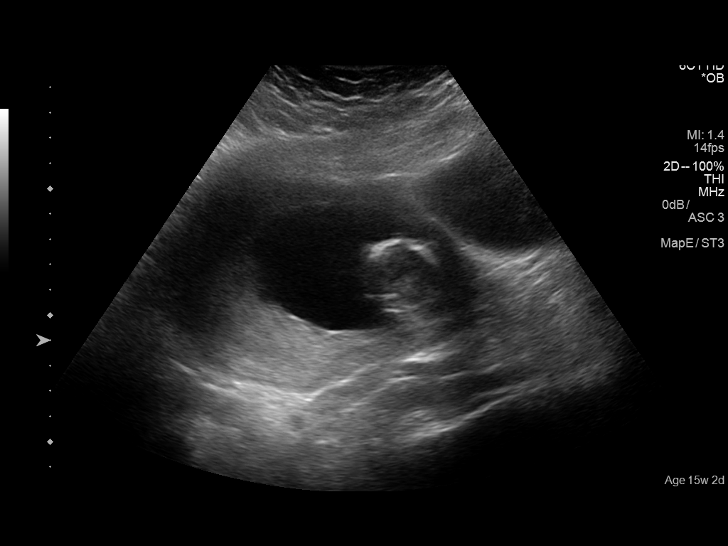
[im 16/84]
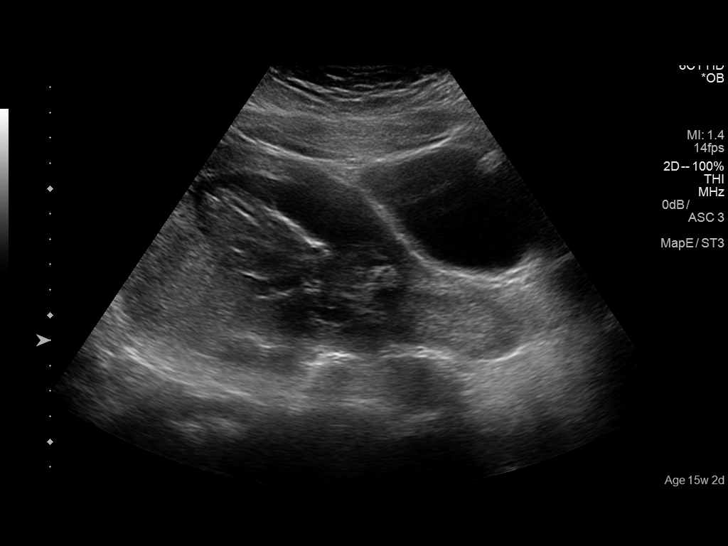
[im 22/84]
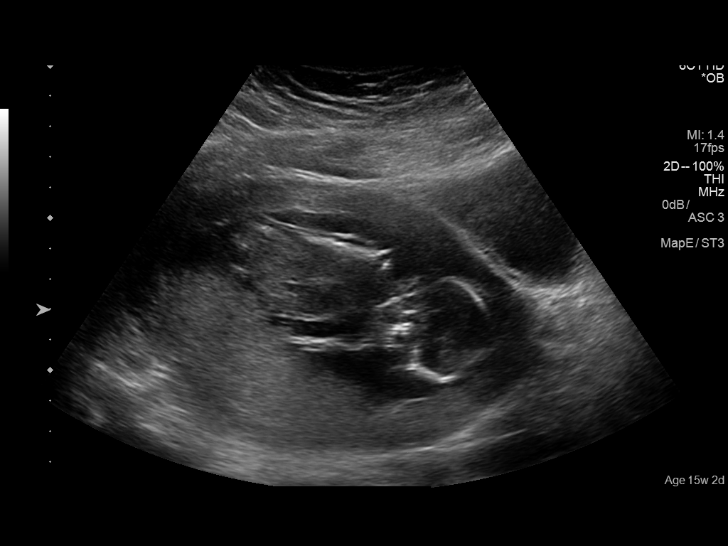
[im 28/84]
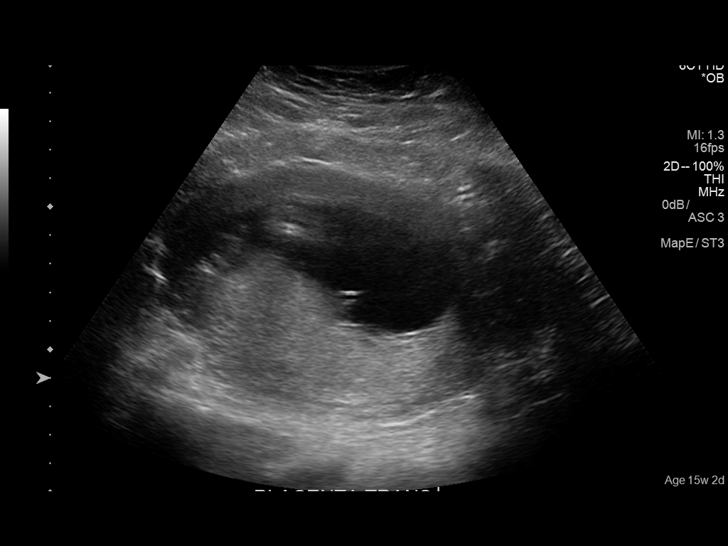
[im 34/84]
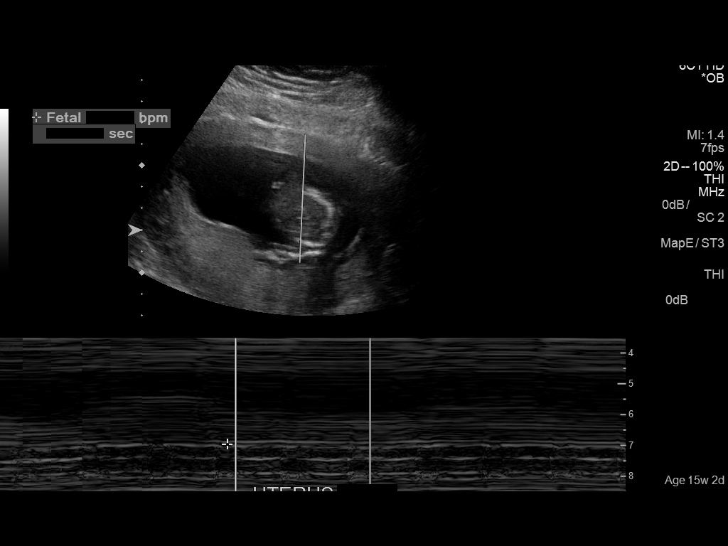
[im 40/84]
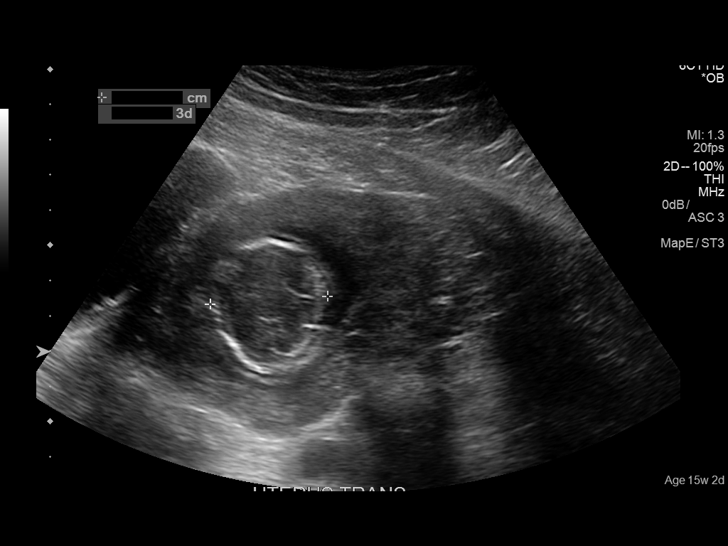
[im 47/84]
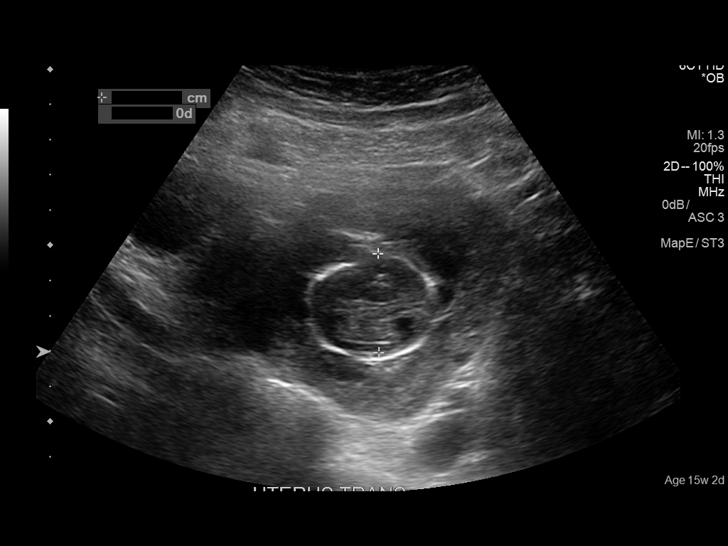
[im 53/84]
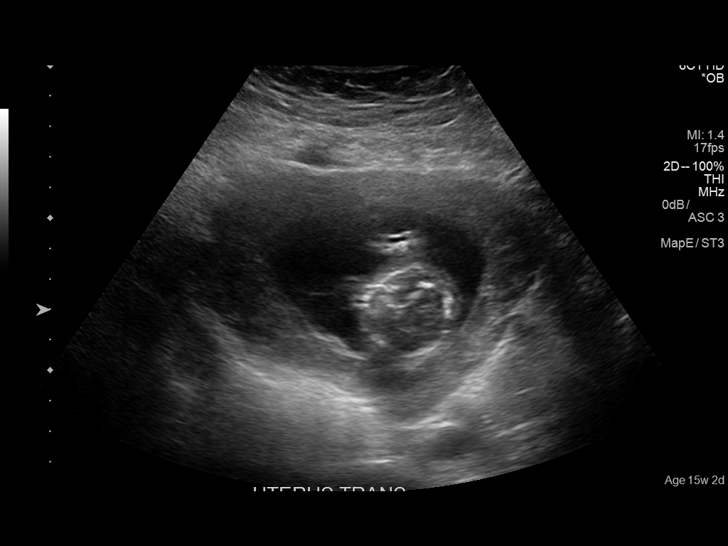
[im 59/84]
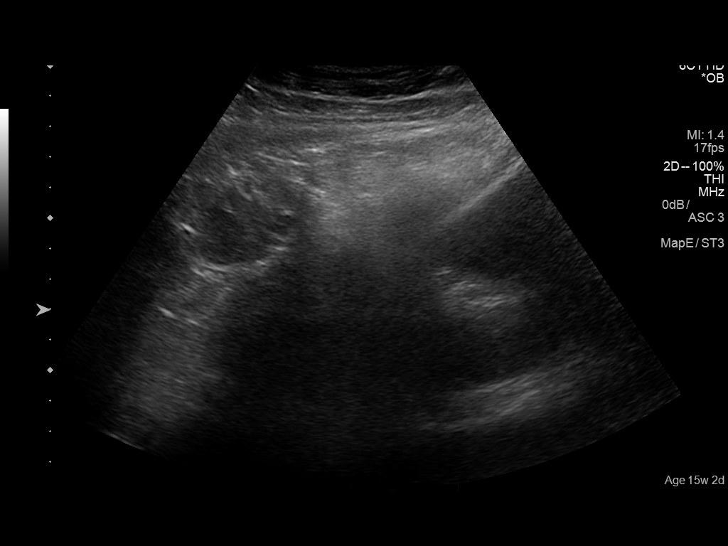
[im 65/84]
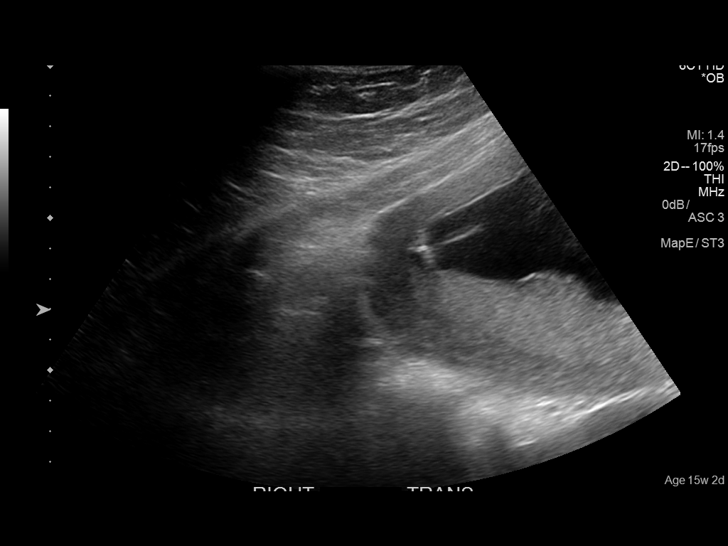
[im 71/84]
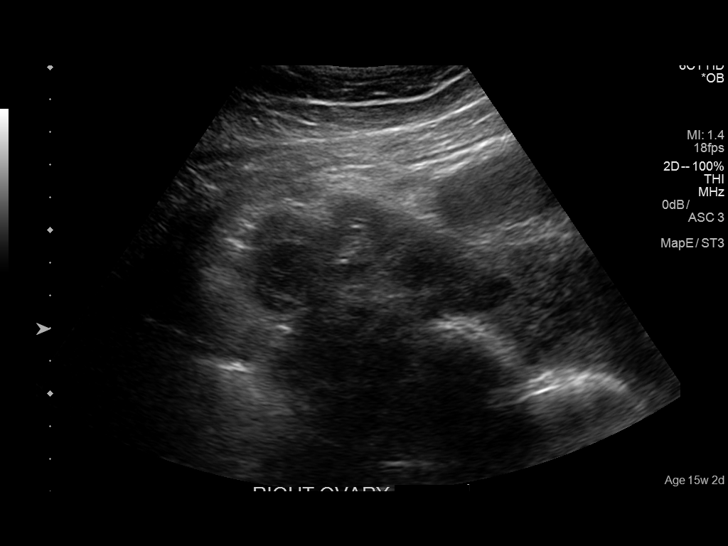
[im 77/84]
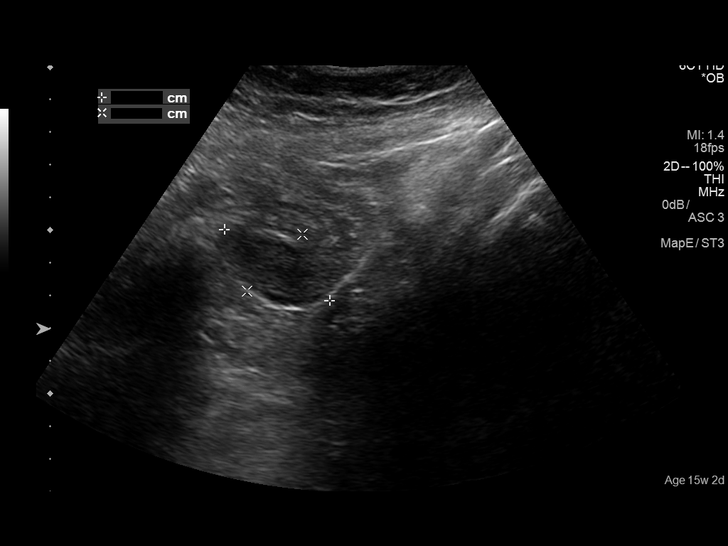
[im 84/84]
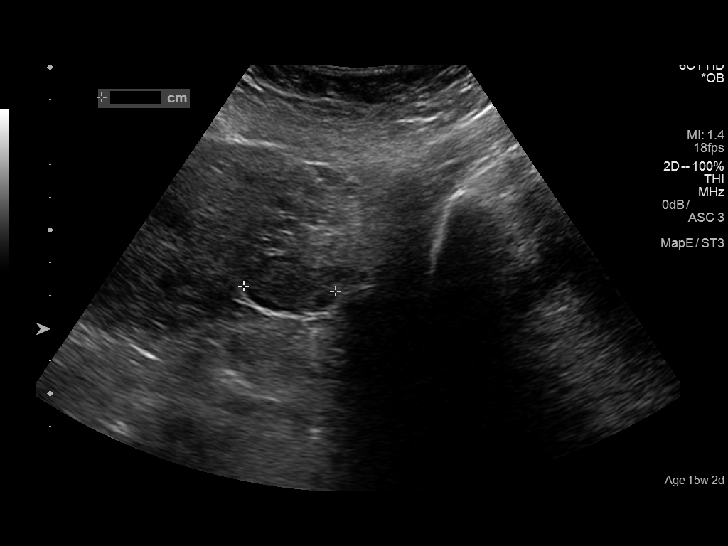

[14 of 28 positions shown; findings below may reference images not displayed]

FINDINGS: Number of Fetuses: 1

Heart Rate:  152 bpm

Movement: Yes

Presentation: Cephalic

Placental Location: Posterior

Previa: No

Amniotic Fluid (Subjective):  Within normal limits.

BPD:  3.0 cm        15 w 3 d

MATERNAL FINDINGS:

Cervix:  Appears closed.

Uterus/Adnexae:  No abnormality visualized.
IMPRESSION: Single live intrauterine pregnancy noted, with a biparietal diameter
of 3.0 cm, corresponding to a gestational age of 15 weeks 3 days.
This matches the gestational age by LMP of 15 weeks 2 days,
reflecting an estimated date of delivery June 19, 2017. The
cervix remains closed. No evidence of placenta previa.

This exam is performed on an emergent basis and does not
comprehensively evaluate fetal size, dating, or anatomy; follow-up
complete OB US should be considered if further fetal assessment is
warranted.

## 2018-01-23 ENCOUNTER — Ambulatory Visit: Payer: Self-pay

## 2019-12-27 ENCOUNTER — Other Ambulatory Visit: Payer: Self-pay

## 2019-12-27 ENCOUNTER — Ambulatory Visit (HOSPITAL_COMMUNITY)
Admission: EM | Admit: 2019-12-27 | Discharge: 2019-12-27 | Disposition: A | Discharge status: Home / Self Care | Payer: Medicaid Other

## 2019-12-27 ENCOUNTER — Encounter (HOSPITAL_COMMUNITY): Payer: Self-pay | Admitting: Family Medicine

## 2019-12-27 DIAGNOSIS — B070 Plantar wart: Secondary | ICD-10-CM | POA: Diagnosis not present

## 2019-12-27 NOTE — ED Triage Notes (Signed)
Pt c/o callous to left foot for approx 1 month. Denies drainage.

## 2019-12-27 NOTE — Discharge Instructions (Signed)
This is a plantar wart.  Soak foot for 15 to 30 minutes in warm water and then dry Apply the Mediplast corn, callus and wart remover to lesion overnight This could take 2 to 3 months before it is gone. If this problem continues you will need to see a podiatrist for follow-up

## 2019-12-29 NOTE — ED Provider Notes (Signed)
[facility]    CSN: [id number] Arrival date & time: 12/27/19  1536      History   Chief Complaint Chief Complaint  Patient presents with  . Callouses    HPI Shelley Scott is a 24 y.o. female.   Patient is a 24 year old female presents today with abnormality to bottom of left foot for approximate 1 month.  Symptoms been constant.  Pain with walking. No injuries to the foot.  No redness, swelling or drainage.  ROS per HPI      Past Medical History:  Diagnosis Date  . Chronic kidney disease    kidneyinfection with this pregnancy    There are no problems to display for this patient.   Past Surgical History:  Procedure Laterality Date  . NO PAST SURGERIES      OB History    Gravida  1   Para      Term      Preterm      AB      Living        SAB      TAB      Ectopic      Multiple      Live Births               Home Medications    Prior to Admission medications   Medication Sig Start Date End Date Taking? Authorizing Provider  Ascorbic Acid (VITAMIN C) 100 MG tablet Take 100 mg by mouth daily.   Yes [provider]    Family History Family History  Family history unknown: Yes    Social History Social History   Tobacco Use  . Smoking status: Former Games developer  . Smokeless tobacco: Former Neurosurgeon  . Tobacco comment: hooka usage but not while preg  Substance Use Topics  . Alcohol use: No  . Drug use: No     Allergies   Pork allergy   Review of Systems Review of Systems   Physical Exam Triage Vital Signs ED Triage Vitals  Enc Vitals Group     BP 12/27/19 1616 104/61     Pulse Rate 12/27/19 1616 (!) 104     Resp 12/27/19 1616 18     Temp 12/27/19 1616 97.6 F (36.4 C)     Temp Source 12/27/19 1616 Oral     SpO2 12/27/19 1616 100 %     Weight --      Height --      Head Circumference --      Peak Flow --      Pain Score 12/27/19 1613 9     Pain Loc --      Pain Edu? --      Excl. in GC? --     No data found.  Updated Vital Signs BP 104/61 (BP Location: Left Arm)   Pulse (!) 104   Temp 97.6 F (36.4 C) (Oral)   Resp 18   LMP 11/19/2019 (Approximate)   SpO2 100%   Visual Acuity Right Eye Distance:   Left Eye Distance:   Bilateral Distance:    Right Eye Near:   Left Eye Near:    Bilateral Near:     Physical Exam Vitals and nursing note reviewed.  Constitutional:      General: She is not in acute distress.    Appearance: Normal appearance. She is not ill-appearing, toxic-appearing or diaphoretic.  HENT:     Head: Normocephalic.     Nose: Nose normal.  Eyes:     Conjunctiva/sclera: Conjunctivae normal.  Pulmonary:     Effort: Pulmonary effort is normal.  Musculoskeletal:        General: Normal range of motion.     Cervical back: Normal range of motion.       Feet:  Feet:     Comments: Plantar wart Skin:    General: Skin is warm and dry.     Findings: No rash.  Neurological:     Mental Status: She is alert.  Psychiatric:        Mood and Affect: Mood normal.      UC Treatments / Results  Labs (all labs ordered are listed, but only abnormal results are displayed) Labs Reviewed - No data to display  EKG   Radiology No results found.  Procedures Procedures (including critical care time)  Medications Ordered in UC Medications - No data to display  Initial Impression / Assessment and Plan / UC Course  I have reviewed the triage vital signs and the nursing notes.  Pertinent labs & imaging results that were available during my care of the patient were reviewed by me and considered in my medical decision making (see chart for details).     Plantar wart to bottom of left foot. Treating with wart remover pads Recommended soak the foot for 15 to 30 minutes in warm water prior to applying the pads and wear the pads overnight. Reassured this can take 2 to 3 months before it is gone. Recommended follow-up with podiatry for any continued  symptoms Final Clinical Impressions(s) / UC Diagnoses   Final diagnoses:  Plantar wart     Discharge Instructions     This is a plantar wart.  Soak foot for 15 to 30 minutes in warm water and then dry Apply the Mediplast corn, callus and wart remover to lesion overnight This could take 2 to 3 months before it is gone. If this problem continues you will need to see a podiatrist for follow-up     ED Prescriptions    None     PDMP not reviewed this encounter.   Orvan July, NP 12/29/19 [phone]
